# Patient Record
Sex: Female | Born: 1950
Health system: Southern US, Community
[De-identification: ages and names within clinical notes are randomized; demographics above are authoritative.]

## PROBLEM LIST (undated history)

## (undated) DIAGNOSIS — D751 Secondary polycythemia: Secondary | ICD-10-CM

## (undated) DIAGNOSIS — D45 Polycythemia vera: Secondary | ICD-10-CM

## (undated) DIAGNOSIS — K219 Gastro-esophageal reflux disease without esophagitis: Secondary | ICD-10-CM

## (undated) DIAGNOSIS — I10 Essential (primary) hypertension: Secondary | ICD-10-CM

## (undated) DIAGNOSIS — Z8739 Personal history of other diseases of the musculoskeletal system and connective tissue: Secondary | ICD-10-CM

## (undated) HISTORY — DX: Essential (primary) hypertension: I10

## (undated) HISTORY — DX: Polycythemia vera: D45

## (undated) HISTORY — DX: Gastro-esophageal reflux disease without esophagitis: K21.9

## (undated) HISTORY — DX: Secondary polycythemia: D75.1

## (undated) HISTORY — DX: Personal history of other diseases of the musculoskeletal system and connective tissue: Z87.39

## (undated) HISTORY — PX: CHOLECYSTECTOMY: SHX55

---

## 1976-05-10 HISTORY — PX: TUBAL LIGATION: SHX77

## 2004-08-04 ENCOUNTER — Ambulatory Visit: Payer: Self-pay | Admitting: Obstetrics and Gynecology

## 2004-08-10 ENCOUNTER — Ambulatory Visit: Payer: Self-pay | Admitting: Obstetrics and Gynecology

## 2005-02-03 ENCOUNTER — Ambulatory Visit: Payer: Self-pay | Admitting: Obstetrics and Gynecology

## 2005-10-21 ENCOUNTER — Ambulatory Visit: Payer: Self-pay | Admitting: Obstetrics and Gynecology

## 2006-10-24 ENCOUNTER — Ambulatory Visit: Payer: Self-pay | Admitting: Obstetrics and Gynecology

## 2007-10-26 ENCOUNTER — Ambulatory Visit: Payer: Self-pay | Admitting: Obstetrics and Gynecology

## 2008-10-28 ENCOUNTER — Ambulatory Visit: Payer: Self-pay | Admitting: Obstetrics and Gynecology

## 2009-10-30 ENCOUNTER — Ambulatory Visit: Payer: Self-pay | Admitting: Obstetrics and Gynecology

## 2010-11-03 ENCOUNTER — Ambulatory Visit: Payer: Self-pay | Admitting: Obstetrics and Gynecology

## 2011-11-04 ENCOUNTER — Ambulatory Visit: Payer: Self-pay | Admitting: Obstetrics and Gynecology

## 2012-11-07 ENCOUNTER — Ambulatory Visit: Payer: Self-pay | Admitting: Obstetrics and Gynecology

## 2013-12-28 ENCOUNTER — Ambulatory Visit: Payer: Self-pay | Admitting: Internal Medicine

## 2013-12-28 LAB — CBC CANCER CENTER
BASOS PCT: 0.8 %
Basophil #: 0.1 x10 3/mm (ref 0.0–0.1)
EOS ABS: 0.1 x10 3/mm (ref 0.0–0.7)
Eosinophil %: 1.6 %
HCT: 48.9 % — ABNORMAL HIGH (ref 35.0–47.0)
HGB: 16 g/dL (ref 12.0–16.0)
LYMPHS PCT: 19 %
Lymphocyte #: 1.7 x10 3/mm (ref 1.0–3.6)
MCH: 28.8 pg (ref 26.0–34.0)
MCHC: 32.7 g/dL (ref 32.0–36.0)
MCV: 88 fL (ref 80–100)
Monocyte #: 0.8 x10 3/mm (ref 0.2–0.9)
Monocyte %: 9.3 %
NEUTROS ABS: 6.3 x10 3/mm (ref 1.4–6.5)
Neutrophil %: 69.3 %
PLATELETS: 311 x10 3/mm (ref 150–440)
RBC: 5.54 10*6/uL — AB (ref 3.80–5.20)
RDW: 14.1 % (ref 11.5–14.5)
WBC: 9.1 x10 3/mm (ref 3.6–11.0)

## 2013-12-28 LAB — IRON AND TIBC
IRON SATURATION: 28 %
Iron Bind.Cap.(Total): 364 ug/dL (ref 250–450)
Iron: 102 ug/dL (ref 50–170)
Unbound Iron-Bind.Cap.: 262 ug/dL

## 2013-12-28 LAB — FERRITIN: FERRITIN (ARMC): 27 ng/mL (ref 8–388)

## 2014-01-04 LAB — CANCER CENTER HEMATOCRIT: HCT: 44.5 % (ref 35.0–47.0)

## 2014-01-07 ENCOUNTER — Ambulatory Visit: Payer: Self-pay | Admitting: Internal Medicine

## 2014-01-08 ENCOUNTER — Ambulatory Visit: Payer: Self-pay | Admitting: Internal Medicine

## 2014-01-11 LAB — CANCER CENTER HEMATOCRIT: HCT: 43.3 % (ref 35.0–47.0)

## 2014-01-18 LAB — CANCER CENTER HEMATOCRIT: HCT: 41.7 % (ref 35.0–47.0)

## 2014-01-25 LAB — CBC CANCER CENTER
BASOS PCT: 0.8 %
Basophil #: 0.1 x10 3/mm (ref 0.0–0.1)
Eosinophil #: 0.2 x10 3/mm (ref 0.0–0.7)
Eosinophil %: 2.4 %
HCT: 42.1 % (ref 35.0–47.0)
HGB: 13.9 g/dL (ref 12.0–16.0)
LYMPHS PCT: 25.1 %
Lymphocyte #: 2.3 x10 3/mm (ref 1.0–3.6)
MCH: 28.9 pg (ref 26.0–34.0)
MCHC: 33 g/dL (ref 32.0–36.0)
MCV: 88 fL (ref 80–100)
MONO ABS: 0.9 x10 3/mm (ref 0.2–0.9)
Monocyte %: 9.6 %
NEUTROS ABS: 5.8 x10 3/mm (ref 1.4–6.5)
Neutrophil %: 62.1 %
Platelet: 348 x10 3/mm (ref 150–440)
RBC: 4.81 10*6/uL (ref 3.80–5.20)
RDW: 13.6 % (ref 11.5–14.5)
WBC: 9.3 x10 3/mm (ref 3.6–11.0)

## 2014-02-07 ENCOUNTER — Ambulatory Visit: Payer: Self-pay | Admitting: Internal Medicine

## 2014-02-15 LAB — CANCER CENTER HEMATOCRIT: HCT: 42.4 % (ref 35.0–47.0)

## 2014-03-08 LAB — CANCER CENTER HEMATOCRIT: HCT: 44.2 % (ref 35.0–47.0)

## 2014-03-10 ENCOUNTER — Ambulatory Visit: Payer: Self-pay | Admitting: Internal Medicine

## 2014-03-29 LAB — HEMATOCRIT: HCT: 40.1 % (ref 35.0–47.0)

## 2014-04-09 ENCOUNTER — Ambulatory Visit: Payer: Self-pay | Admitting: Internal Medicine

## 2014-04-19 LAB — CANCER CENTER HEMATOCRIT: HCT: 40 % (ref 35.0–47.0)

## 2014-05-08 LAB — CANCER CENTER HEMATOCRIT: HCT: 41.1 % (ref 35.0–47.0)

## 2014-05-10 ENCOUNTER — Ambulatory Visit: Payer: Self-pay | Admitting: Internal Medicine

## 2014-06-21 ENCOUNTER — Ambulatory Visit: Payer: Self-pay | Admitting: Internal Medicine

## 2014-07-09 ENCOUNTER — Ambulatory Visit
Admit: 2014-07-09 | Disposition: A | Discharge status: Home / Self Care | Payer: Self-pay | Attending: Internal Medicine | Admitting: Internal Medicine

## 2014-08-09 ENCOUNTER — Ambulatory Visit
Admit: 2014-08-09 | Disposition: A | Discharge status: Home / Self Care | Payer: Self-pay | Attending: Internal Medicine | Admitting: Internal Medicine

## 2014-10-04 ENCOUNTER — Other Ambulatory Visit: Payer: Self-pay | Admitting: *Deleted

## 2014-10-04 ENCOUNTER — Encounter (INDEPENDENT_AMBULATORY_CARE_PROVIDER_SITE_OTHER): Payer: Self-pay

## 2014-10-04 ENCOUNTER — Inpatient Hospital Stay: Payer: BLUE CROSS/BLUE SHIELD

## 2014-10-04 ENCOUNTER — Inpatient Hospital Stay: Payer: BLUE CROSS/BLUE SHIELD | Attending: Internal Medicine

## 2014-10-04 DIAGNOSIS — D751 Secondary polycythemia: Secondary | ICD-10-CM

## 2014-10-04 DIAGNOSIS — D45 Polycythemia vera: Secondary | ICD-10-CM | POA: Diagnosis present

## 2014-10-04 LAB — HEMATOCRIT: HCT: 41.7 % (ref 35.0–47.0)

## 2014-12-27 ENCOUNTER — Inpatient Hospital Stay: Payer: BLUE CROSS/BLUE SHIELD | Attending: Internal Medicine

## 2014-12-27 ENCOUNTER — Inpatient Hospital Stay: Payer: BLUE CROSS/BLUE SHIELD

## 2014-12-27 DIAGNOSIS — D45 Polycythemia vera: Secondary | ICD-10-CM | POA: Insufficient documentation

## 2014-12-27 DIAGNOSIS — D751 Secondary polycythemia: Secondary | ICD-10-CM

## 2014-12-27 LAB — HEMATOCRIT: HEMATOCRIT: 40.4 % (ref 35.0–47.0)

## 2014-12-31 ENCOUNTER — Other Ambulatory Visit: Payer: Self-pay | Admitting: Internal Medicine

## 2014-12-31 DIAGNOSIS — Z1231 Encounter for screening mammogram for malignant neoplasm of breast: Secondary | ICD-10-CM

## 2015-01-09 ENCOUNTER — Ambulatory Visit
Admission: RE | Admit: 2015-01-09 | Discharge: 2015-01-09 | Disposition: A | Discharge status: Home / Self Care | Payer: BLUE CROSS/BLUE SHIELD | Source: Ambulatory Visit | Attending: Internal Medicine | Admitting: Internal Medicine

## 2015-01-09 DIAGNOSIS — Z1231 Encounter for screening mammogram for malignant neoplasm of breast: Secondary | ICD-10-CM | POA: Diagnosis present

## 2015-03-21 ENCOUNTER — Inpatient Hospital Stay: Payer: BLUE CROSS/BLUE SHIELD

## 2015-03-21 ENCOUNTER — Inpatient Hospital Stay: Payer: BLUE CROSS/BLUE SHIELD | Attending: Internal Medicine

## 2015-03-21 DIAGNOSIS — D751 Secondary polycythemia: Secondary | ICD-10-CM

## 2015-03-21 DIAGNOSIS — D45 Polycythemia vera: Secondary | ICD-10-CM | POA: Insufficient documentation

## 2015-03-21 LAB — HEMATOCRIT: HCT: 39.5 % (ref 35.0–47.0)

## 2015-06-11 ENCOUNTER — Encounter: Payer: Self-pay | Admitting: *Deleted

## 2015-06-11 ENCOUNTER — Other Ambulatory Visit: Payer: Self-pay | Admitting: *Deleted

## 2015-06-11 DIAGNOSIS — D751 Secondary polycythemia: Secondary | ICD-10-CM

## 2015-06-13 ENCOUNTER — Inpatient Hospital Stay: Payer: BLUE CROSS/BLUE SHIELD | Attending: Internal Medicine | Admitting: Internal Medicine

## 2015-06-13 ENCOUNTER — Inpatient Hospital Stay: Payer: BLUE CROSS/BLUE SHIELD

## 2015-06-13 ENCOUNTER — Encounter: Payer: Self-pay | Admitting: Internal Medicine

## 2015-06-13 VITALS — BP 136/68 | HR 76 | Temp 97.4°F | Resp 18 | Ht 63.0 in | Wt 229.9 lb

## 2015-06-13 DIAGNOSIS — K219 Gastro-esophageal reflux disease without esophagitis: Secondary | ICD-10-CM | POA: Diagnosis not present

## 2015-06-13 DIAGNOSIS — D751 Secondary polycythemia: Secondary | ICD-10-CM

## 2015-06-13 DIAGNOSIS — I10 Essential (primary) hypertension: Secondary | ICD-10-CM | POA: Insufficient documentation

## 2015-06-13 DIAGNOSIS — Z79899 Other long term (current) drug therapy: Secondary | ICD-10-CM | POA: Diagnosis not present

## 2015-06-13 LAB — CBC WITH DIFFERENTIAL/PLATELET
BASOS PCT: 1 %
Basophils Absolute: 0.1 10*3/uL (ref 0–0.1)
EOS ABS: 0.2 10*3/uL (ref 0–0.7)
EOS PCT: 2 %
HEMATOCRIT: 40.2 % (ref 35.0–47.0)
HEMOGLOBIN: 13.3 g/dL (ref 12.0–16.0)
Lymphocytes Relative: 20 %
Lymphs Abs: 1.7 10*3/uL (ref 1.0–3.6)
MCH: 23.8 pg — AB (ref 26.0–34.0)
MCHC: 33.1 g/dL (ref 32.0–36.0)
MCV: 71.8 fL — ABNORMAL LOW (ref 80.0–100.0)
MONO ABS: 0.6 10*3/uL (ref 0.2–0.9)
MONOS PCT: 7 %
NEUTROS PCT: 70 %
Neutro Abs: 6 10*3/uL (ref 1.4–6.5)
Platelets: 339 10*3/uL (ref 150–440)
RBC: 5.6 MIL/uL — AB (ref 3.80–5.20)
RDW: 16.4 % — ABNORMAL HIGH (ref 11.5–14.5)
WBC: 8.6 10*3/uL (ref 3.6–11.0)

## 2015-06-13 NOTE — Progress Notes (Signed)
[facility] OFFICE PROGRESS NOTE  Patient Care Team: Idelle Crouch, MD as PCP - General (Internal Medicine)   SUMMARY OF ONCOLOGIC HISTORY:  # AUG 2015- POLYCYTHEMIA SECONDARY- Jak-2 & exon12- NEG  INTERVAL HISTORY:  This is my first interaction with the patient since I joined the practice September 2016. I reviewed the patient's prior charts/pertinent labs/imaging in detail; findings are summarized above.   A very pleasant 65 year old obese Caucasian female patient with above history of secondary erythrocytosis is here for follow-up. She had had phlebotomies every 3-4 months; most recent being in October 2016.  Patient denies any recent DVT PEs. Denies any symptomatic improvement after phlebotomies.  She denies any blood in stools black stools.    REVIEW OF SYSTEMS:  A complete 10 point review of system is done which is negative except mentioned above/history of present illness.   PAST MEDICAL HISTORY :  Past Medical History  Diagnosis Date  . Erythrocytosis   . Polycythemia vera ([place])   . Hypertension   . GERD (gastroesophageal reflux disease)   . H/O degenerative disc disease     PAST SURGICAL HISTORY :   Past Surgical History  Procedure Laterality Date  . Cholecystectomy    . Tubal ligation  1978    FAMILY HISTORY :   Family History  Problem Relation Age of Onset  . Diabetes Mother   . Hypertension Mother   . Hypertension Father   . Heart disease Father   . Cervical cancer Daughter   . Uterine cancer Daughter     SOCIAL HISTORY:   Social History  Substance Use Topics  . Smoking status: Never Smoker   . Smokeless tobacco: Never Used  . Alcohol Use: No    ALLERGIES:  has No Known Allergies.  MEDICATIONS:  Current Outpatient Prescriptions  Medication Sig Dispense Refill  . cetirizine (ZYRTEC) 10 MG tablet Take 10 mg by mouth daily.    . hydrochlorothiazide (HYDRODIURIL) 25 MG tablet Take 25 mg by mouth daily.    . metoprolol  (LOPRESSOR) 100 MG tablet Take 100 mg by mouth daily.    . Multiple Vitamins-Minerals (MULTIVITAMIN WITH MINERALS) tablet Take 1 tablet by mouth daily.    Marland Kitchen omeprazole (PRILOSEC) 20 MG capsule Take 20 mg by mouth daily.     No current facility-administered medications for this visit.    PHYSICAL EXAMINATION:   BP 136/68 mmHg  Pulse 76  Temp(Src) 97.4 F (36.3 C) (Tympanic)  Resp 18  Ht 5\' 3"  (1.6 m)  Wt 229 lb 15 oz (104.3 kg)  BMI 40.74 kg/m2  Filed Weights   06/13/15 1430  Weight: 229 lb 15 oz (104.3 kg)    GENERAL: Well-nourished well-developed; Alert, no distress and comfortable. Obese.  EYES: no pallor or icterus OROPHARYNX: no thrush or ulceration; good dentition  NECK: supple, no masses felt LYMPH:  no palpable lymphadenopathy in the cervical, axillary or inguinal regions LUNGS: clear to auscultation and  No wheeze or crackles HEART/CVS: regular rate & rhythm and no murmurs; No lower extremity edema ABDOMEN:abdomen soft, non-tender and normal bowel sounds Musculoskeletal:no cyanosis of digits and no clubbing  PSYCH: alert & oriented x 3 with fluent speech NEURO: no focal motor/sensory deficits SKIN:  no rashes or significant lesions  LABORATORY DATA:  I have reviewed the data as listed No results found for: NA, K, CL, CO2, GLUCOSE, BUN, CREATININE, CALCIUM, PROT, ALBUMIN, AST, ALT, ALKPHOS, BILITOT, GFRNONAA, GFRAA  No results found for: SPEP, UPEP  Lab  Results  Component Value Date   WBC 8.6 06/13/2015   NEUTROABS 6.0 06/13/2015   HGB 13.3 06/13/2015   HCT 40.2 06/13/2015   MCV 71.8* 06/13/2015   PLT 339 06/13/2015      Chemistry   No results found for: NA, K, CL, CO2, BUN, CREATININE, GLU No results found for: CALCIUM, ALKPHOS, AST, ALT, BILITOT      ASSESSMENT & PLAN:   # ERYTHROCYTOSIS- likely secondary. Today hemoglobin is 13/hematocrit is 40. As this is likely secondary erythrocytosis- I would not recommend any phlebotomies unless patient is  symptomatic from elevated hematocrit [symptoms like headaches/generalized malaise etc.].  #  Microcytosis left to from phlebotomies/without anemia.likely iron deficiency. Patient not losing any blood in stools or black stools. Patient has previous colonoscopies. Patient stated that she is again to sometime this year. I asked her to Dr. PCP regarding follow-up colonoscopy. She agrees.  At the patient's request she will be discharged from the clinic. She was given a copy of the blood work today.  # 15 minutes face-to-face with the patient discussing the above plan of care; more than 50% of time spent on natural history; counseling and coordination.     Cammie Sickle, MD 06/13/2015 2:40 PM

## 2015-09-16 DIAGNOSIS — D751 Secondary polycythemia: Secondary | ICD-10-CM | POA: Diagnosis not present

## 2015-12-22 ENCOUNTER — Other Ambulatory Visit: Payer: Self-pay | Admitting: Internal Medicine

## 2015-12-22 DIAGNOSIS — Z1211 Encounter for screening for malignant neoplasm of colon: Secondary | ICD-10-CM | POA: Diagnosis not present

## 2015-12-22 DIAGNOSIS — Z1239 Encounter for other screening for malignant neoplasm of breast: Secondary | ICD-10-CM | POA: Diagnosis not present

## 2015-12-22 DIAGNOSIS — Z01419 Encounter for gynecological examination (general) (routine) without abnormal findings: Secondary | ICD-10-CM | POA: Diagnosis not present

## 2015-12-22 DIAGNOSIS — Z0000 Encounter for general adult medical examination without abnormal findings: Secondary | ICD-10-CM | POA: Diagnosis not present

## 2015-12-22 DIAGNOSIS — Z1231 Encounter for screening mammogram for malignant neoplasm of breast: Secondary | ICD-10-CM

## 2015-12-22 DIAGNOSIS — I10 Essential (primary) hypertension: Secondary | ICD-10-CM | POA: Diagnosis not present

## 2015-12-22 DIAGNOSIS — E7800 Pure hypercholesterolemia, unspecified: Secondary | ICD-10-CM | POA: Diagnosis not present

## 2015-12-22 DIAGNOSIS — Z79899 Other long term (current) drug therapy: Secondary | ICD-10-CM | POA: Diagnosis not present

## 2015-12-23 DIAGNOSIS — E7800 Pure hypercholesterolemia, unspecified: Secondary | ICD-10-CM | POA: Diagnosis not present

## 2015-12-23 DIAGNOSIS — Z79899 Other long term (current) drug therapy: Secondary | ICD-10-CM | POA: Diagnosis not present

## 2015-12-23 DIAGNOSIS — I10 Essential (primary) hypertension: Secondary | ICD-10-CM | POA: Diagnosis not present

## 2016-01-13 ENCOUNTER — Ambulatory Visit
Admission: RE | Admit: 2016-01-13 | Discharge: 2016-01-13 | Disposition: A | Discharge status: Home / Self Care | Payer: PPO | Source: Ambulatory Visit | Attending: Internal Medicine | Admitting: Internal Medicine

## 2016-01-13 DIAGNOSIS — Z1231 Encounter for screening mammogram for malignant neoplasm of breast: Secondary | ICD-10-CM | POA: Insufficient documentation

## 2016-01-30 DIAGNOSIS — Z23 Encounter for immunization: Secondary | ICD-10-CM | POA: Diagnosis not present

## 2016-04-23 DIAGNOSIS — H2513 Age-related nuclear cataract, bilateral: Secondary | ICD-10-CM | POA: Diagnosis not present

## 2016-06-21 DIAGNOSIS — Z0000 Encounter for general adult medical examination without abnormal findings: Secondary | ICD-10-CM | POA: Diagnosis not present

## 2016-06-21 DIAGNOSIS — E7800 Pure hypercholesterolemia, unspecified: Secondary | ICD-10-CM | POA: Diagnosis not present

## 2016-06-21 DIAGNOSIS — Z79899 Other long term (current) drug therapy: Secondary | ICD-10-CM | POA: Diagnosis not present

## 2016-06-21 DIAGNOSIS — I10 Essential (primary) hypertension: Secondary | ICD-10-CM | POA: Diagnosis not present

## 2016-06-21 DIAGNOSIS — F418 Other specified anxiety disorders: Secondary | ICD-10-CM | POA: Diagnosis not present

## 2016-06-21 DIAGNOSIS — M5137 Other intervertebral disc degeneration, lumbosacral region: Secondary | ICD-10-CM | POA: Diagnosis not present

## 2016-06-28 DIAGNOSIS — Z1211 Encounter for screening for malignant neoplasm of colon: Secondary | ICD-10-CM | POA: Diagnosis not present

## 2016-12-21 ENCOUNTER — Other Ambulatory Visit: Payer: Self-pay | Admitting: Internal Medicine

## 2016-12-21 DIAGNOSIS — Z1231 Encounter for screening mammogram for malignant neoplasm of breast: Secondary | ICD-10-CM

## 2016-12-22 DIAGNOSIS — M5137 Other intervertebral disc degeneration, lumbosacral region: Secondary | ICD-10-CM | POA: Diagnosis not present

## 2016-12-22 DIAGNOSIS — Z79899 Other long term (current) drug therapy: Secondary | ICD-10-CM | POA: Diagnosis not present

## 2016-12-22 DIAGNOSIS — Z0000 Encounter for general adult medical examination without abnormal findings: Secondary | ICD-10-CM | POA: Diagnosis not present

## 2016-12-22 DIAGNOSIS — I10 Essential (primary) hypertension: Secondary | ICD-10-CM | POA: Diagnosis not present

## 2016-12-22 DIAGNOSIS — Z1231 Encounter for screening mammogram for malignant neoplasm of breast: Secondary | ICD-10-CM | POA: Diagnosis not present

## 2016-12-22 DIAGNOSIS — E7800 Pure hypercholesterolemia, unspecified: Secondary | ICD-10-CM | POA: Diagnosis not present

## 2016-12-22 DIAGNOSIS — D751 Secondary polycythemia: Secondary | ICD-10-CM | POA: Diagnosis not present

## 2016-12-30 DIAGNOSIS — I10 Essential (primary) hypertension: Secondary | ICD-10-CM | POA: Diagnosis not present

## 2017-01-17 ENCOUNTER — Ambulatory Visit
Admission: RE | Admit: 2017-01-17 | Discharge: 2017-01-17 | Disposition: A | Discharge status: Home / Self Care | Payer: PPO | Source: Ambulatory Visit | Attending: Internal Medicine | Admitting: Internal Medicine

## 2017-01-17 DIAGNOSIS — Z1231 Encounter for screening mammogram for malignant neoplasm of breast: Secondary | ICD-10-CM | POA: Diagnosis not present

## 2017-01-19 ENCOUNTER — Other Ambulatory Visit: Payer: Self-pay | Admitting: Internal Medicine

## 2017-01-19 DIAGNOSIS — N6489 Other specified disorders of breast: Secondary | ICD-10-CM

## 2017-01-19 DIAGNOSIS — R928 Other abnormal and inconclusive findings on diagnostic imaging of breast: Secondary | ICD-10-CM

## 2017-01-25 ENCOUNTER — Ambulatory Visit
Admission: RE | Admit: 2017-01-25 | Discharge: 2017-01-25 | Disposition: A | Discharge status: Home / Self Care | Payer: PPO | Source: Ambulatory Visit | Attending: Internal Medicine | Admitting: Internal Medicine

## 2017-01-25 DIAGNOSIS — N6489 Other specified disorders of breast: Secondary | ICD-10-CM | POA: Diagnosis not present

## 2017-01-25 DIAGNOSIS — R928 Other abnormal and inconclusive findings on diagnostic imaging of breast: Secondary | ICD-10-CM | POA: Diagnosis not present

## 2017-01-28 DIAGNOSIS — Z23 Encounter for immunization: Secondary | ICD-10-CM | POA: Diagnosis not present

## 2017-06-24 DIAGNOSIS — Z1211 Encounter for screening for malignant neoplasm of colon: Secondary | ICD-10-CM | POA: Diagnosis not present

## 2017-06-24 DIAGNOSIS — M5137 Other intervertebral disc degeneration, lumbosacral region: Secondary | ICD-10-CM | POA: Diagnosis not present

## 2017-06-24 DIAGNOSIS — Z79899 Other long term (current) drug therapy: Secondary | ICD-10-CM | POA: Diagnosis not present

## 2017-06-24 DIAGNOSIS — Z0000 Encounter for general adult medical examination without abnormal findings: Secondary | ICD-10-CM | POA: Diagnosis not present

## 2017-06-24 DIAGNOSIS — I10 Essential (primary) hypertension: Secondary | ICD-10-CM | POA: Diagnosis not present

## 2017-06-24 DIAGNOSIS — D751 Secondary polycythemia: Secondary | ICD-10-CM | POA: Diagnosis not present

## 2017-06-24 DIAGNOSIS — E7800 Pure hypercholesterolemia, unspecified: Secondary | ICD-10-CM | POA: Diagnosis not present

## 2017-08-19 DIAGNOSIS — Z1211 Encounter for screening for malignant neoplasm of colon: Secondary | ICD-10-CM | POA: Diagnosis not present

## 2018-01-02 DIAGNOSIS — Z0000 Encounter for general adult medical examination without abnormal findings: Secondary | ICD-10-CM | POA: Diagnosis not present

## 2018-01-02 DIAGNOSIS — Z13820 Encounter for screening for osteoporosis: Secondary | ICD-10-CM | POA: Diagnosis not present

## 2018-01-02 DIAGNOSIS — Z79899 Other long term (current) drug therapy: Secondary | ICD-10-CM | POA: Diagnosis not present

## 2018-01-02 DIAGNOSIS — D751 Secondary polycythemia: Secondary | ICD-10-CM | POA: Diagnosis not present

## 2018-01-02 DIAGNOSIS — I10 Essential (primary) hypertension: Secondary | ICD-10-CM | POA: Diagnosis not present

## 2018-01-02 DIAGNOSIS — E7800 Pure hypercholesterolemia, unspecified: Secondary | ICD-10-CM | POA: Diagnosis not present

## 2018-01-02 DIAGNOSIS — Z1239 Encounter for other screening for malignant neoplasm of breast: Secondary | ICD-10-CM | POA: Diagnosis not present

## 2018-01-03 ENCOUNTER — Other Ambulatory Visit: Payer: Self-pay | Admitting: Internal Medicine

## 2018-01-03 DIAGNOSIS — Z1231 Encounter for screening mammogram for malignant neoplasm of breast: Secondary | ICD-10-CM

## 2018-01-11 DIAGNOSIS — Z780 Asymptomatic menopausal state: Secondary | ICD-10-CM | POA: Diagnosis not present

## 2018-01-26 ENCOUNTER — Ambulatory Visit
Admission: RE | Admit: 2018-01-26 | Discharge: 2018-01-26 | Disposition: A | Discharge status: Home / Self Care | Payer: PPO | Source: Ambulatory Visit | Attending: Internal Medicine | Admitting: Internal Medicine

## 2018-01-26 DIAGNOSIS — Z1231 Encounter for screening mammogram for malignant neoplasm of breast: Secondary | ICD-10-CM

## 2018-02-27 DIAGNOSIS — H35372 Puckering of macula, left eye: Secondary | ICD-10-CM | POA: Diagnosis not present

## 2018-02-27 DIAGNOSIS — H2513 Age-related nuclear cataract, bilateral: Secondary | ICD-10-CM | POA: Diagnosis not present

## 2018-07-03 DIAGNOSIS — E7800 Pure hypercholesterolemia, unspecified: Secondary | ICD-10-CM | POA: Diagnosis not present

## 2018-07-03 DIAGNOSIS — Z79899 Other long term (current) drug therapy: Secondary | ICD-10-CM | POA: Diagnosis not present

## 2018-07-03 DIAGNOSIS — Z1211 Encounter for screening for malignant neoplasm of colon: Secondary | ICD-10-CM | POA: Diagnosis not present

## 2018-07-03 DIAGNOSIS — I10 Essential (primary) hypertension: Secondary | ICD-10-CM | POA: Diagnosis not present

## 2018-07-03 DIAGNOSIS — K219 Gastro-esophageal reflux disease without esophagitis: Secondary | ICD-10-CM | POA: Diagnosis not present

## 2018-07-03 DIAGNOSIS — Z0000 Encounter for general adult medical examination without abnormal findings: Secondary | ICD-10-CM | POA: Diagnosis not present

## 2018-07-03 DIAGNOSIS — M5137 Other intervertebral disc degeneration, lumbosacral region: Secondary | ICD-10-CM | POA: Diagnosis not present

## 2018-07-12 DIAGNOSIS — Z1212 Encounter for screening for malignant neoplasm of rectum: Secondary | ICD-10-CM | POA: Diagnosis not present

## 2018-07-12 DIAGNOSIS — Z1211 Encounter for screening for malignant neoplasm of colon: Secondary | ICD-10-CM | POA: Diagnosis not present

## 2019-01-09 DIAGNOSIS — I10 Essential (primary) hypertension: Secondary | ICD-10-CM | POA: Diagnosis not present

## 2019-01-09 DIAGNOSIS — E7800 Pure hypercholesterolemia, unspecified: Secondary | ICD-10-CM | POA: Diagnosis not present

## 2019-01-09 DIAGNOSIS — R7989 Other specified abnormal findings of blood chemistry: Secondary | ICD-10-CM | POA: Diagnosis not present

## 2019-01-09 DIAGNOSIS — Z1239 Encounter for other screening for malignant neoplasm of breast: Secondary | ICD-10-CM | POA: Diagnosis not present

## 2019-01-09 DIAGNOSIS — D751 Secondary polycythemia: Secondary | ICD-10-CM | POA: Diagnosis not present

## 2019-01-09 DIAGNOSIS — Z79899 Other long term (current) drug therapy: Secondary | ICD-10-CM | POA: Diagnosis not present

## 2019-01-09 DIAGNOSIS — Z0000 Encounter for general adult medical examination without abnormal findings: Secondary | ICD-10-CM | POA: Diagnosis not present

## 2019-01-09 DIAGNOSIS — K219 Gastro-esophageal reflux disease without esophagitis: Secondary | ICD-10-CM | POA: Diagnosis not present

## 2019-01-11 ENCOUNTER — Other Ambulatory Visit: Payer: Self-pay | Admitting: Internal Medicine

## 2019-01-11 DIAGNOSIS — Z1231 Encounter for screening mammogram for malignant neoplasm of breast: Secondary | ICD-10-CM

## 2019-02-09 DIAGNOSIS — Z23 Encounter for immunization: Secondary | ICD-10-CM | POA: Diagnosis not present

## 2019-02-14 ENCOUNTER — Ambulatory Visit
Admission: RE | Admit: 2019-02-14 | Discharge: 2019-02-14 | Disposition: A | Discharge status: Home / Self Care | Payer: PPO | Source: Ambulatory Visit | Attending: Internal Medicine | Admitting: Internal Medicine

## 2019-02-14 DIAGNOSIS — Z1231 Encounter for screening mammogram for malignant neoplasm of breast: Secondary | ICD-10-CM

## 2019-07-10 DIAGNOSIS — K219 Gastro-esophageal reflux disease without esophagitis: Secondary | ICD-10-CM | POA: Diagnosis not present

## 2019-07-10 DIAGNOSIS — I10 Essential (primary) hypertension: Secondary | ICD-10-CM | POA: Diagnosis not present

## 2019-07-10 DIAGNOSIS — Z79899 Other long term (current) drug therapy: Secondary | ICD-10-CM | POA: Diagnosis not present

## 2019-07-10 DIAGNOSIS — E7800 Pure hypercholesterolemia, unspecified: Secondary | ICD-10-CM | POA: Diagnosis not present

## 2019-07-10 DIAGNOSIS — Z0000 Encounter for general adult medical examination without abnormal findings: Secondary | ICD-10-CM | POA: Diagnosis not present

## 2019-11-21 DIAGNOSIS — H40003 Preglaucoma, unspecified, bilateral: Secondary | ICD-10-CM | POA: Diagnosis not present

## 2020-01-11 ENCOUNTER — Other Ambulatory Visit: Payer: Self-pay | Admitting: Internal Medicine

## 2020-01-11 DIAGNOSIS — Z1231 Encounter for screening mammogram for malignant neoplasm of breast: Secondary | ICD-10-CM

## 2020-01-16 DIAGNOSIS — I10 Essential (primary) hypertension: Secondary | ICD-10-CM | POA: Diagnosis not present

## 2020-01-16 DIAGNOSIS — D751 Secondary polycythemia: Secondary | ICD-10-CM | POA: Diagnosis not present

## 2020-01-16 DIAGNOSIS — Z0000 Encounter for general adult medical examination without abnormal findings: Secondary | ICD-10-CM | POA: Diagnosis not present

## 2020-01-16 DIAGNOSIS — E7800 Pure hypercholesterolemia, unspecified: Secondary | ICD-10-CM | POA: Diagnosis not present

## 2020-01-16 DIAGNOSIS — Z79899 Other long term (current) drug therapy: Secondary | ICD-10-CM | POA: Diagnosis not present

## 2020-01-16 DIAGNOSIS — K219 Gastro-esophageal reflux disease without esophagitis: Secondary | ICD-10-CM | POA: Diagnosis not present

## 2020-01-16 DIAGNOSIS — R946 Abnormal results of thyroid function studies: Secondary | ICD-10-CM | POA: Diagnosis not present

## 2020-02-13 DIAGNOSIS — Z23 Encounter for immunization: Secondary | ICD-10-CM | POA: Diagnosis not present

## 2020-02-15 ENCOUNTER — Ambulatory Visit
Admission: RE | Admit: 2020-02-15 | Discharge: 2020-02-15 | Disposition: A | Discharge status: Home / Self Care | Payer: PPO | Source: Ambulatory Visit | Attending: Internal Medicine | Admitting: Internal Medicine

## 2020-02-15 ENCOUNTER — Other Ambulatory Visit: Payer: Self-pay

## 2020-02-15 DIAGNOSIS — Z1231 Encounter for screening mammogram for malignant neoplasm of breast: Secondary | ICD-10-CM | POA: Diagnosis not present

## 2020-02-20 ENCOUNTER — Other Ambulatory Visit: Payer: Self-pay | Admitting: Internal Medicine

## 2020-02-20 DIAGNOSIS — N6320 Unspecified lump in the left breast, unspecified quadrant: Secondary | ICD-10-CM

## 2020-02-20 DIAGNOSIS — R928 Other abnormal and inconclusive findings on diagnostic imaging of breast: Secondary | ICD-10-CM

## 2020-02-29 ENCOUNTER — Other Ambulatory Visit: Payer: Self-pay

## 2020-02-29 ENCOUNTER — Ambulatory Visit
Admission: RE | Admit: 2020-02-29 | Discharge: 2020-02-29 | Disposition: A | Discharge status: Home / Self Care | Payer: PPO | Source: Ambulatory Visit | Attending: Internal Medicine | Admitting: Internal Medicine

## 2020-02-29 DIAGNOSIS — N6320 Unspecified lump in the left breast, unspecified quadrant: Secondary | ICD-10-CM | POA: Diagnosis not present

## 2020-02-29 DIAGNOSIS — N6321 Unspecified lump in the left breast, upper outer quadrant: Secondary | ICD-10-CM | POA: Diagnosis not present

## 2020-02-29 DIAGNOSIS — N6323 Unspecified lump in the left breast, lower outer quadrant: Secondary | ICD-10-CM | POA: Diagnosis not present

## 2020-02-29 DIAGNOSIS — R928 Other abnormal and inconclusive findings on diagnostic imaging of breast: Secondary | ICD-10-CM

## 2020-03-03 ENCOUNTER — Other Ambulatory Visit: Payer: Self-pay | Admitting: Internal Medicine

## 2020-03-03 DIAGNOSIS — N6320 Unspecified lump in the left breast, unspecified quadrant: Secondary | ICD-10-CM

## 2020-03-18 DIAGNOSIS — J019 Acute sinusitis, unspecified: Secondary | ICD-10-CM | POA: Diagnosis not present

## 2020-04-16 DIAGNOSIS — E7800 Pure hypercholesterolemia, unspecified: Secondary | ICD-10-CM | POA: Diagnosis not present

## 2020-04-16 DIAGNOSIS — I10 Essential (primary) hypertension: Secondary | ICD-10-CM | POA: Diagnosis not present

## 2020-04-16 DIAGNOSIS — Z79899 Other long term (current) drug therapy: Secondary | ICD-10-CM | POA: Diagnosis not present

## 2020-09-01 ENCOUNTER — Ambulatory Visit
Admission: RE | Admit: 2020-09-01 | Discharge: 2020-09-01 | Disposition: A | Discharge status: Home / Self Care | Payer: PPO | Source: Ambulatory Visit | Attending: Internal Medicine | Admitting: Internal Medicine

## 2020-09-01 ENCOUNTER — Other Ambulatory Visit: Payer: Self-pay

## 2020-09-01 DIAGNOSIS — R928 Other abnormal and inconclusive findings on diagnostic imaging of breast: Secondary | ICD-10-CM | POA: Insufficient documentation

## 2020-09-01 DIAGNOSIS — N6320 Unspecified lump in the left breast, unspecified quadrant: Secondary | ICD-10-CM | POA: Diagnosis not present

## 2020-09-03 ENCOUNTER — Other Ambulatory Visit: Payer: Self-pay | Admitting: Internal Medicine

## 2020-09-03 DIAGNOSIS — N6320 Unspecified lump in the left breast, unspecified quadrant: Secondary | ICD-10-CM

## 2020-09-03 DIAGNOSIS — R928 Other abnormal and inconclusive findings on diagnostic imaging of breast: Secondary | ICD-10-CM

## 2020-09-03 DIAGNOSIS — Z1231 Encounter for screening mammogram for malignant neoplasm of breast: Secondary | ICD-10-CM

## 2020-09-04 DIAGNOSIS — Z0000 Encounter for general adult medical examination without abnormal findings: Secondary | ICD-10-CM | POA: Diagnosis not present

## 2020-09-04 DIAGNOSIS — M5137 Other intervertebral disc degeneration, lumbosacral region: Secondary | ICD-10-CM | POA: Diagnosis not present

## 2020-09-04 DIAGNOSIS — Z79899 Other long term (current) drug therapy: Secondary | ICD-10-CM | POA: Diagnosis not present

## 2020-09-04 DIAGNOSIS — G629 Polyneuropathy, unspecified: Secondary | ICD-10-CM | POA: Diagnosis not present

## 2020-09-04 DIAGNOSIS — Z1211 Encounter for screening for malignant neoplasm of colon: Secondary | ICD-10-CM | POA: Diagnosis not present

## 2020-09-04 DIAGNOSIS — E7800 Pure hypercholesterolemia, unspecified: Secondary | ICD-10-CM | POA: Diagnosis not present

## 2020-09-04 DIAGNOSIS — D751 Secondary polycythemia: Secondary | ICD-10-CM | POA: Diagnosis not present

## 2020-09-04 DIAGNOSIS — I10 Essential (primary) hypertension: Secondary | ICD-10-CM | POA: Diagnosis not present

## 2020-11-16 IMAGING — MG MM DIGITAL DIAGNOSTIC UNILAT*L* W/ TOMO W/ CAD
4 series · 4 of 12 positions shown · non-contrast
Comparison: Previous exam(s).

CLINICAL DATA: Patient was recalled from screening mammogram for
possible mass in the left breast.

EXAM:
DIGITAL DIAGNOSTIC LEFT MAMMOGRAM WITH CAD AND TOMO
ULTRASOUND LEFT BREAST

[L MLO synth-2D]
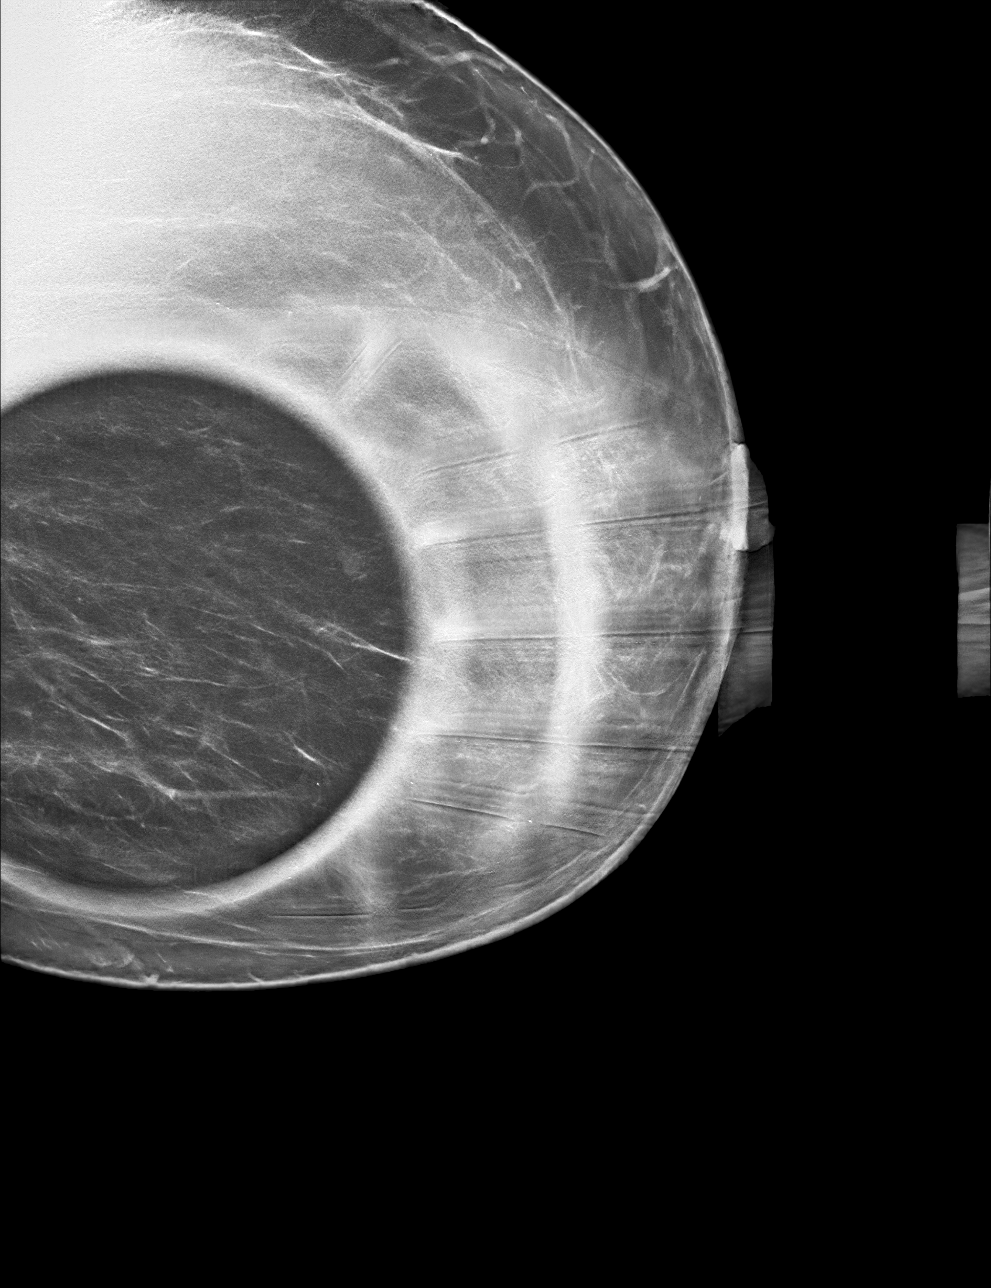

[L ML synth-2D]
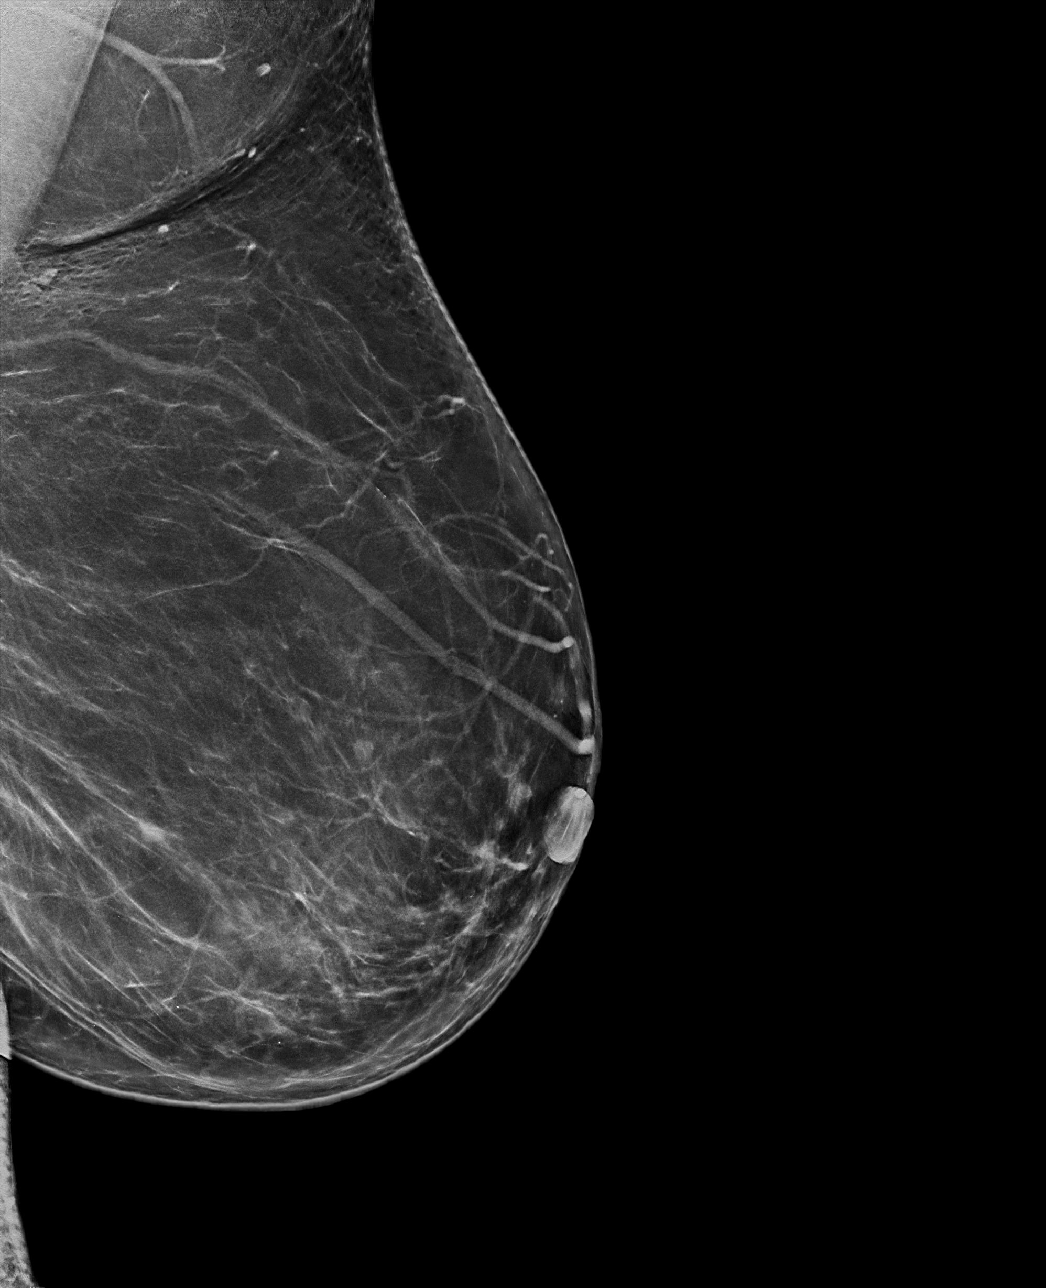

[L MLO tomo · tomo slice 39/77.0]
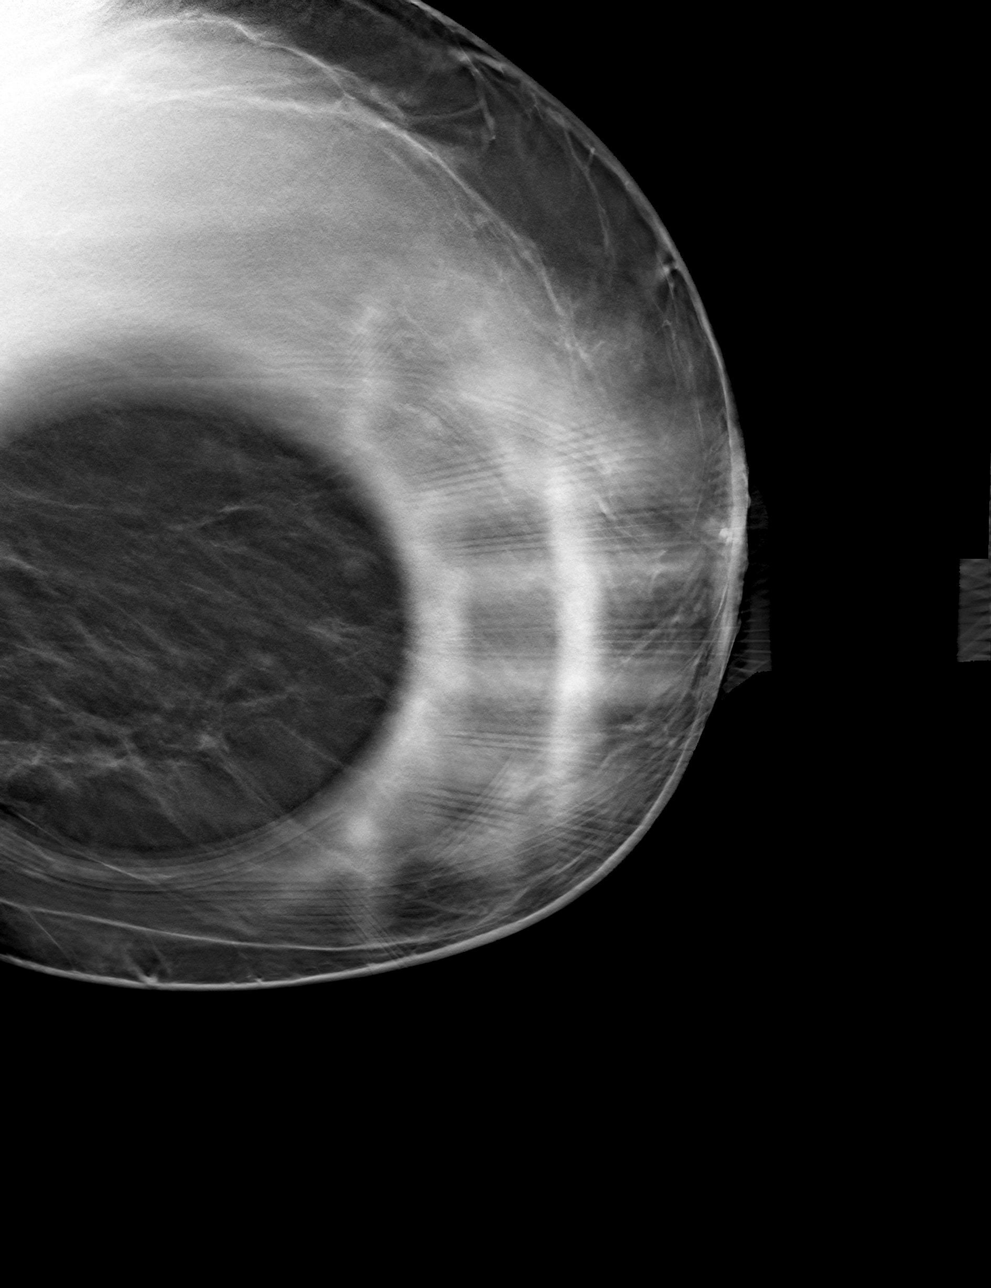

[L ML tomo · tomo slice 43/86.0]
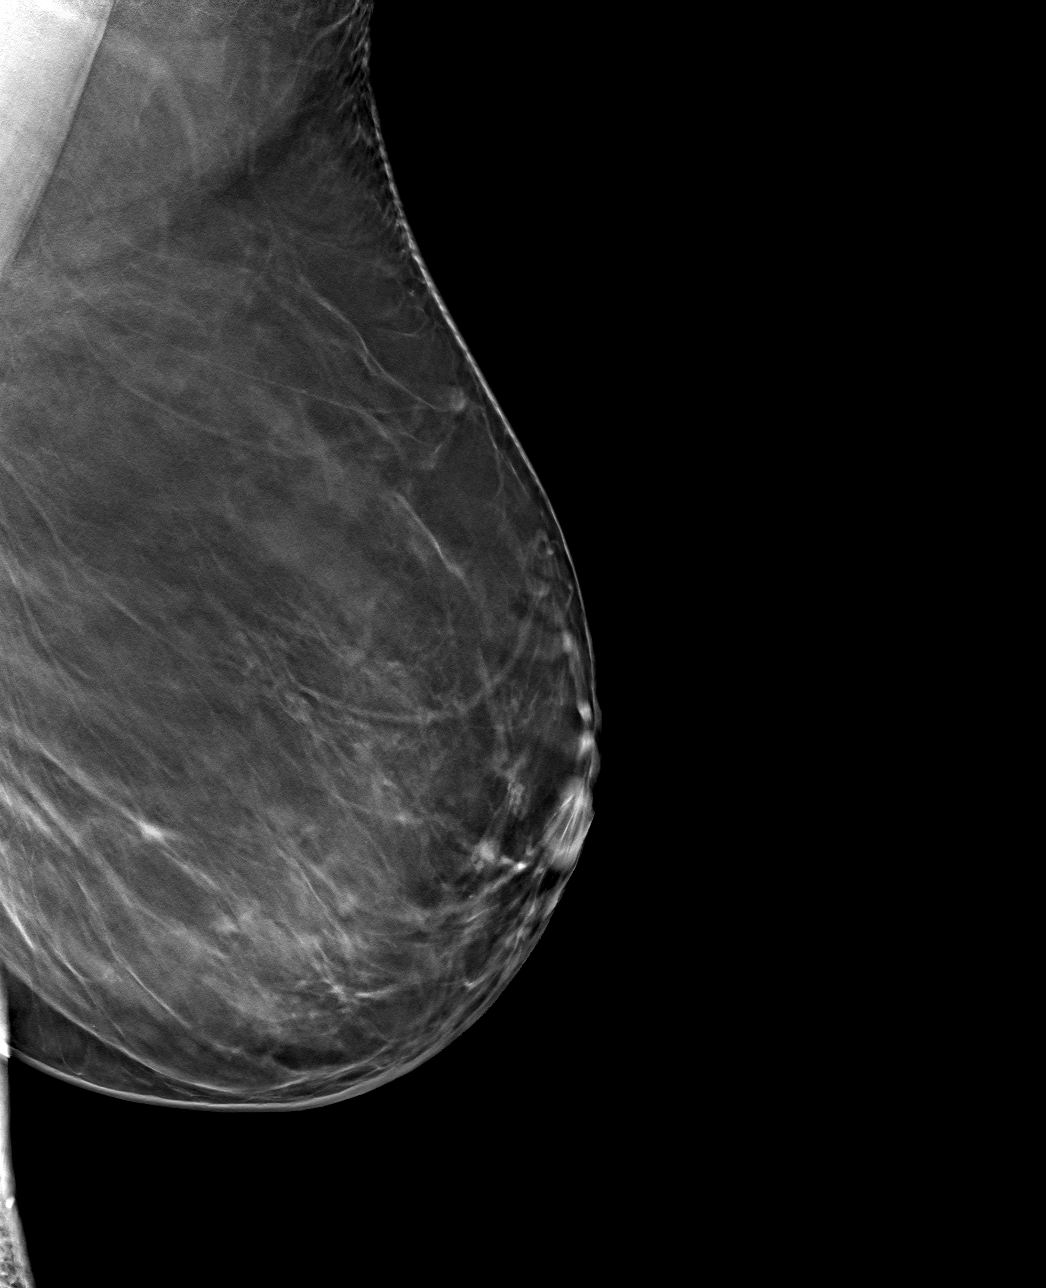

[4 of 12 positions shown; findings below may reference images not displayed]

ACR Breast Density Category b: There are scattered areas of
fibroglandular density.
FINDINGS: Additional imaging of the left breast was performed. There is
persistence of a circumscribed 4 mm mass in the lateral retroareolar
region of the left breast. There are no malignant type
microcalcifications.

Mammographic images were processed with CAD.

Targeted ultrasound is performed, showing a near anechoic mass in
the retroareolar region of the left breast at 3 o'clock measuring 5
x 4 x 4 mm. No additional mass is seen in the retroareolar region of
the breast.
IMPRESSION: Probable benign mass in the left breast.

RECOMMENDATION:
Short-term interval follow-up left mammogram and ultrasound in 6
months is recommended.

I have discussed the findings and recommendations with the patient.
If applicable, a reminder letter will be sent to the patient
regarding the next appointment.

BI-RADS CATEGORY  3: Probably benign.

## 2020-11-21 DIAGNOSIS — H40003 Preglaucoma, unspecified, bilateral: Secondary | ICD-10-CM | POA: Diagnosis not present

## 2020-11-21 DIAGNOSIS — H2513 Age-related nuclear cataract, bilateral: Secondary | ICD-10-CM | POA: Diagnosis not present

## 2021-02-05 DIAGNOSIS — Z23 Encounter for immunization: Secondary | ICD-10-CM | POA: Diagnosis not present

## 2021-02-17 ENCOUNTER — Other Ambulatory Visit: Payer: Self-pay

## 2021-02-17 ENCOUNTER — Ambulatory Visit
Admission: RE | Admit: 2021-02-17 | Discharge: 2021-02-17 | Disposition: A | Discharge status: Home / Self Care | Payer: PPO | Source: Ambulatory Visit | Attending: Internal Medicine | Admitting: Internal Medicine

## 2021-02-17 DIAGNOSIS — R928 Other abnormal and inconclusive findings on diagnostic imaging of breast: Secondary | ICD-10-CM | POA: Diagnosis not present

## 2021-02-17 DIAGNOSIS — R922 Inconclusive mammogram: Secondary | ICD-10-CM | POA: Diagnosis not present

## 2021-02-17 DIAGNOSIS — Z1231 Encounter for screening mammogram for malignant neoplasm of breast: Secondary | ICD-10-CM | POA: Diagnosis not present

## 2021-02-17 DIAGNOSIS — N6320 Unspecified lump in the left breast, unspecified quadrant: Secondary | ICD-10-CM | POA: Insufficient documentation

## 2021-03-06 DIAGNOSIS — Z6841 Body Mass Index (BMI) 40.0 and over, adult: Secondary | ICD-10-CM | POA: Diagnosis not present

## 2021-03-06 DIAGNOSIS — R946 Abnormal results of thyroid function studies: Secondary | ICD-10-CM | POA: Diagnosis not present

## 2021-03-06 DIAGNOSIS — Z0000 Encounter for general adult medical examination without abnormal findings: Secondary | ICD-10-CM | POA: Diagnosis not present

## 2021-03-06 DIAGNOSIS — E7800 Pure hypercholesterolemia, unspecified: Secondary | ICD-10-CM | POA: Diagnosis not present

## 2021-03-06 DIAGNOSIS — M5137 Other intervertebral disc degeneration, lumbosacral region: Secondary | ICD-10-CM | POA: Diagnosis not present

## 2021-03-06 DIAGNOSIS — D751 Secondary polycythemia: Secondary | ICD-10-CM | POA: Diagnosis not present

## 2021-03-06 DIAGNOSIS — Z79899 Other long term (current) drug therapy: Secondary | ICD-10-CM | POA: Diagnosis not present

## 2021-03-06 DIAGNOSIS — I10 Essential (primary) hypertension: Secondary | ICD-10-CM | POA: Diagnosis not present

## 2021-07-29 DIAGNOSIS — Z1211 Encounter for screening for malignant neoplasm of colon: Secondary | ICD-10-CM | POA: Diagnosis not present

## 2021-08-11 LAB — COLOGUARD: COLOGUARD: NEGATIVE

## 2021-09-04 DIAGNOSIS — I10 Essential (primary) hypertension: Secondary | ICD-10-CM | POA: Diagnosis not present

## 2021-09-04 DIAGNOSIS — M5137 Other intervertebral disc degeneration, lumbosacral region: Secondary | ICD-10-CM | POA: Diagnosis not present

## 2021-09-04 DIAGNOSIS — E782 Mixed hyperlipidemia: Secondary | ICD-10-CM | POA: Diagnosis not present

## 2021-09-04 DIAGNOSIS — Z6841 Body Mass Index (BMI) 40.0 and over, adult: Secondary | ICD-10-CM | POA: Diagnosis not present

## 2021-09-04 DIAGNOSIS — Z0000 Encounter for general adult medical examination without abnormal findings: Secondary | ICD-10-CM | POA: Diagnosis not present

## 2021-09-04 DIAGNOSIS — D751 Secondary polycythemia: Secondary | ICD-10-CM | POA: Diagnosis not present

## 2021-09-04 DIAGNOSIS — R946 Abnormal results of thyroid function studies: Secondary | ICD-10-CM | POA: Diagnosis not present

## 2021-09-04 DIAGNOSIS — Z79899 Other long term (current) drug therapy: Secondary | ICD-10-CM | POA: Diagnosis not present

## 2021-11-05 IMAGING — MG DIGITAL DIAGNOSTIC BILAT W/ TOMO W/ CAD
8 series · 8 of 24 positions shown · non-contrast
Comparison: Previous exam(s).

CLINICAL DATA: 70-year-old female presenting for annual exam as
well as 1 year follow-up of a probably benign left breast mass.

EXAM:
DIGITAL DIAGNOSTIC BILATERAL MAMMOGRAM WITH TOMOSYNTHESIS AND CAD;
ULTRASOUND LEFT BREAST LIMITED
TECHNIQUE: Bilateral digital diagnostic mammography and breast tomosynthesis
was performed. The images were evaluated with computer-aided
detection.; Targeted ultrasound examination of the left breast was
performed.

[L CC synth-2D]
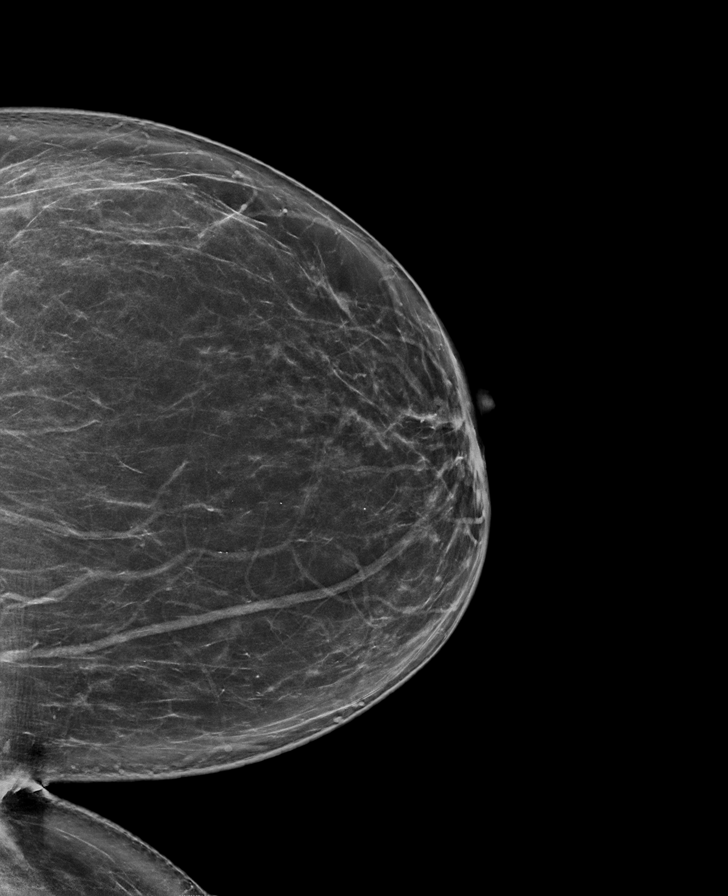

[R MLO synth-2D]
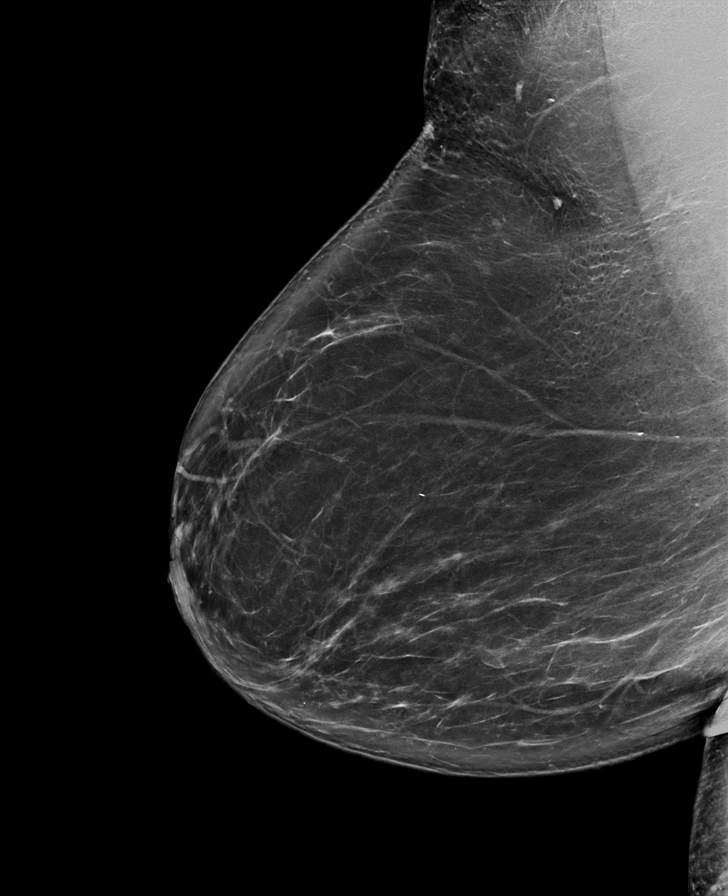

[R CC synth-2D]
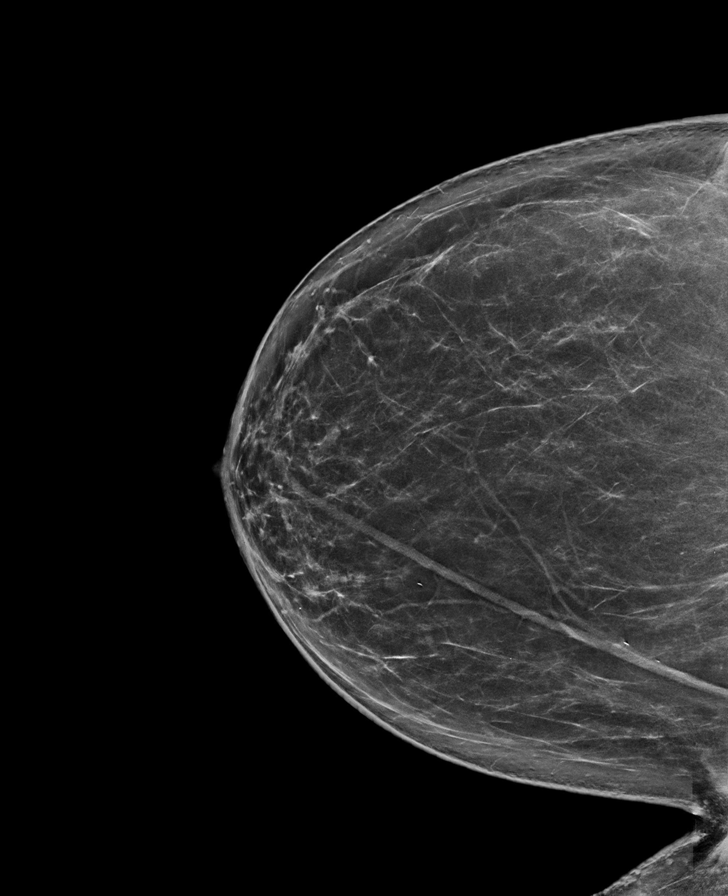

[L MLO synth-2D]
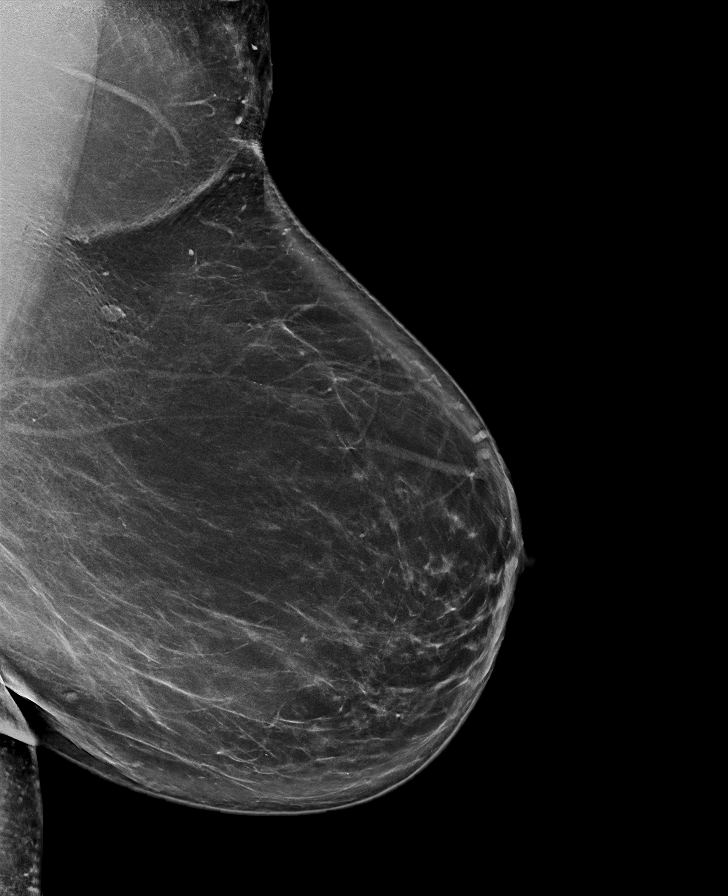

[R MLO tomo · tomo slice 47/94.0]
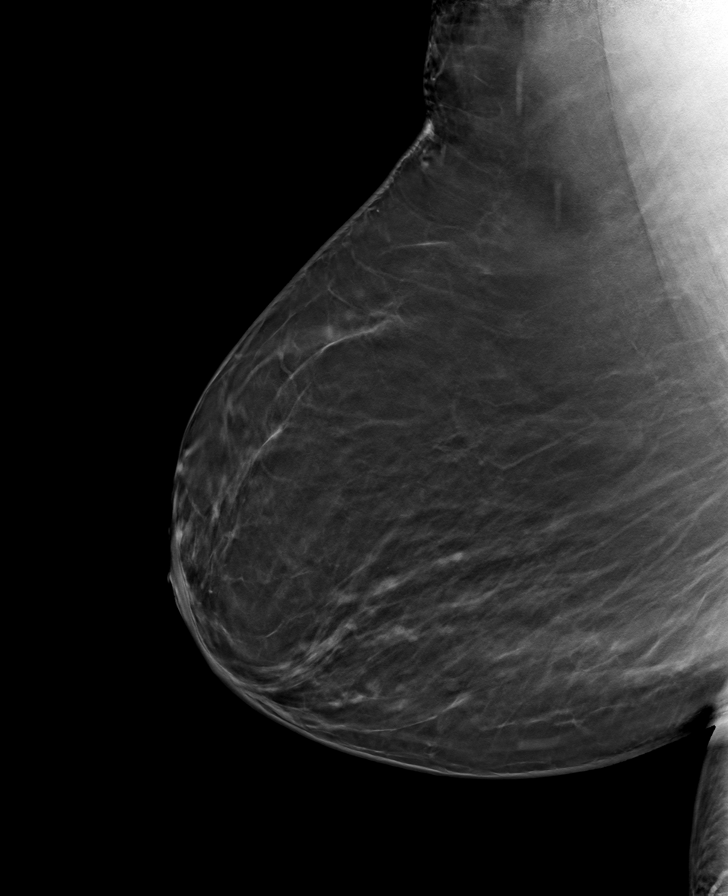

[L MLO tomo · tomo slice 49/98.0]
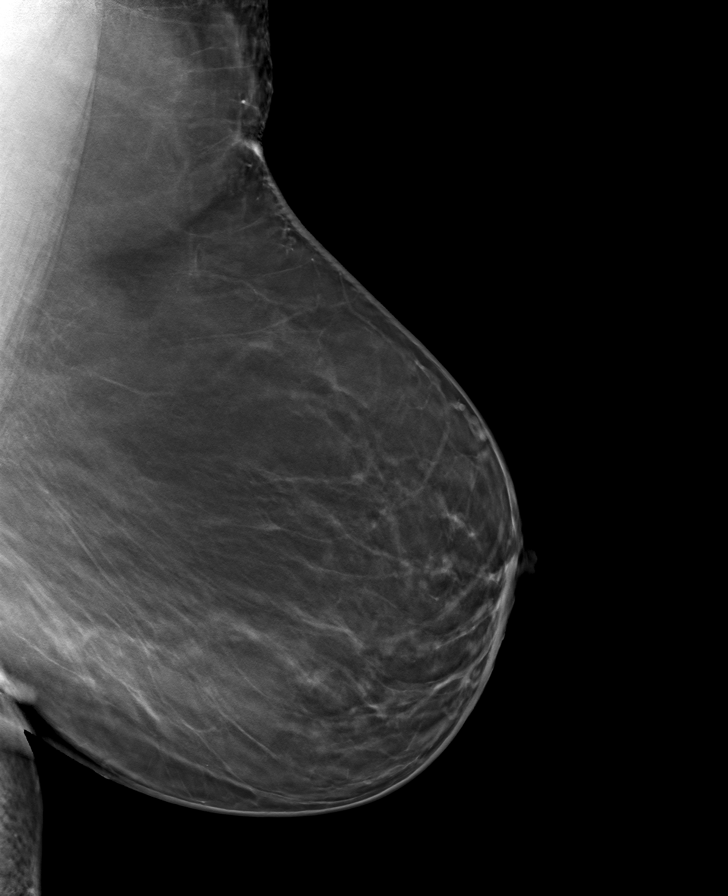

[R CC tomo · tomo slice 42/83.0]
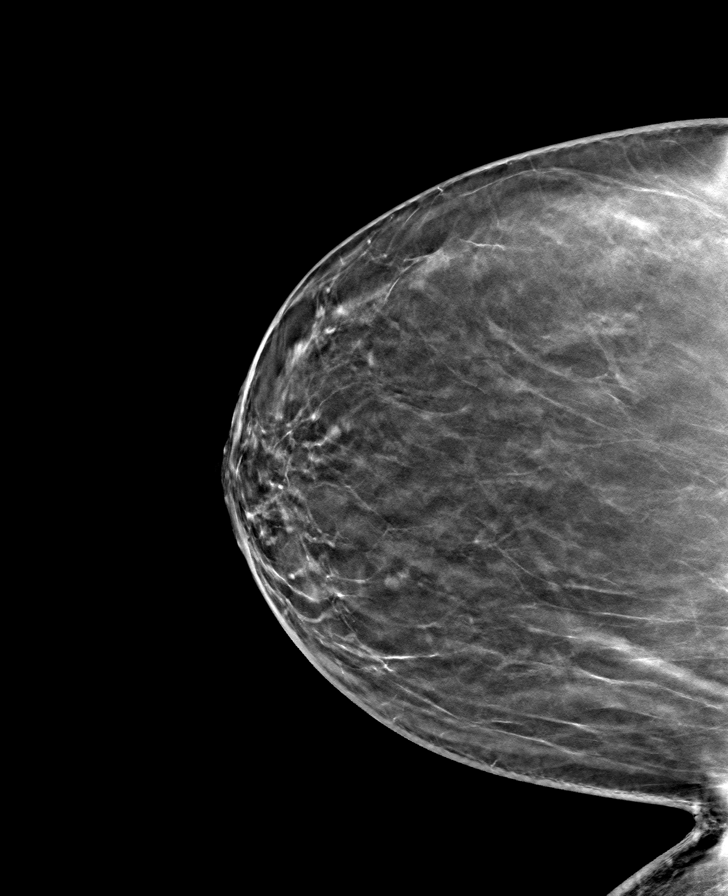

[L CC tomo · tomo slice 41/81.0]
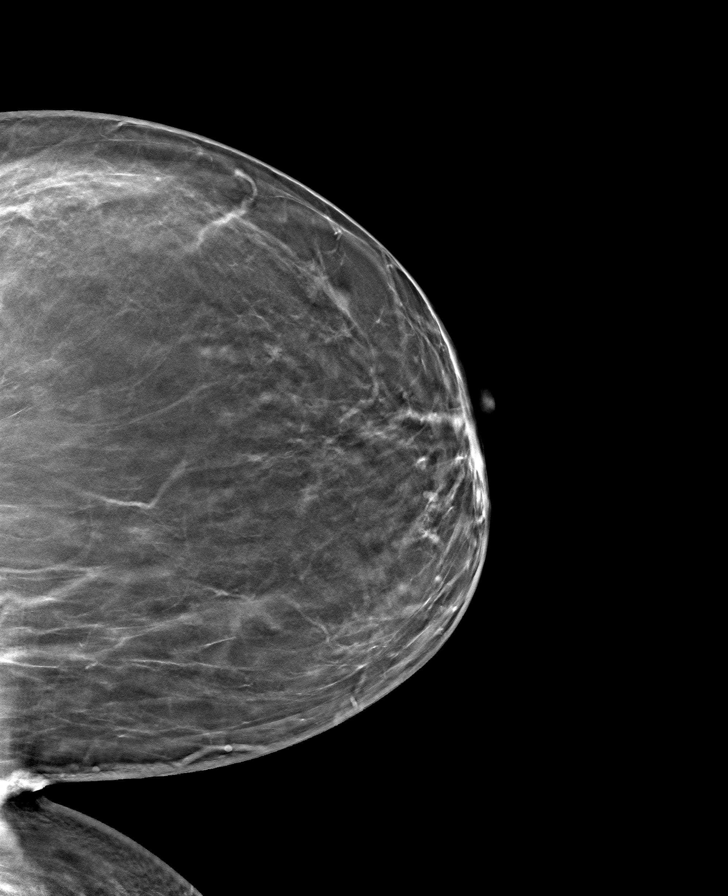

[8 of 24 positions shown; findings below may reference images not displayed]

ACR Breast Density Category b: There are scattered areas of
fibroglandular density.
FINDINGS: Mammogram:

Right breast: No suspicious mass, distortion, or microcalcifications
are identified to suggest presence of malignancy.

Left breast: The small mass in the lateral subareolar left breast is
stable to slightly decreased in size compared to prior. There are no
new suspicious findings elsewhere in the left breast.

Ultrasound:

Targeted ultrasound performed in the left breast at 3 o'clock
retroareolar demonstrating a small cystic mass measuring 0.5 x 0.4 x
0.4 cm, previously measuring 0.5 x 0.4 x 0.5 cm. No internal
vascularity.
IMPRESSION: 1. Stable probably benign mass in the left breast at retroareolar 3
o'clock.

2.  No mammographic evidence of malignancy in the right breast.

RECOMMENDATION:
Diagnostic bilateral mammogram and possible left breast ultrasound
in 1 year.

I have discussed the findings and recommendations with the patient.
If applicable, a reminder letter will be sent to the patient
regarding the next appointment.

BI-RADS CATEGORY  3: Probably benign.

## 2021-11-24 DIAGNOSIS — H2513 Age-related nuclear cataract, bilateral: Secondary | ICD-10-CM | POA: Diagnosis not present

## 2021-11-24 DIAGNOSIS — H43812 Vitreous degeneration, left eye: Secondary | ICD-10-CM | POA: Diagnosis not present

## 2021-11-24 DIAGNOSIS — H40003 Preglaucoma, unspecified, bilateral: Secondary | ICD-10-CM | POA: Diagnosis not present

## 2022-01-14 ENCOUNTER — Other Ambulatory Visit: Payer: Self-pay | Admitting: Internal Medicine

## 2022-01-14 DIAGNOSIS — N63 Unspecified lump in unspecified breast: Secondary | ICD-10-CM

## 2022-02-11 ENCOUNTER — Encounter: Payer: Self-pay | Admitting: Internal Medicine

## 2022-02-18 ENCOUNTER — Ambulatory Visit
Admission: RE | Admit: 2022-02-18 | Discharge: 2022-02-18 | Disposition: A | Discharge status: Home / Self Care | Payer: PPO | Source: Ambulatory Visit | Attending: Internal Medicine | Admitting: Internal Medicine

## 2022-02-18 DIAGNOSIS — N6325 Unspecified lump in the left breast, overlapping quadrants: Secondary | ICD-10-CM | POA: Diagnosis not present

## 2022-02-18 DIAGNOSIS — N6323 Unspecified lump in the left breast, lower outer quadrant: Secondary | ICD-10-CM | POA: Diagnosis not present

## 2022-02-18 DIAGNOSIS — N63 Unspecified lump in unspecified breast: Secondary | ICD-10-CM

## 2022-02-18 DIAGNOSIS — N6320 Unspecified lump in the left breast, unspecified quadrant: Secondary | ICD-10-CM | POA: Diagnosis present

## 2022-02-18 DIAGNOSIS — N6321 Unspecified lump in the left breast, upper outer quadrant: Secondary | ICD-10-CM | POA: Diagnosis not present

## 2022-02-18 DIAGNOSIS — R928 Other abnormal and inconclusive findings on diagnostic imaging of breast: Secondary | ICD-10-CM | POA: Diagnosis not present

## 2022-03-08 DIAGNOSIS — R7989 Other specified abnormal findings of blood chemistry: Secondary | ICD-10-CM | POA: Diagnosis not present

## 2022-03-08 DIAGNOSIS — D751 Secondary polycythemia: Secondary | ICD-10-CM | POA: Diagnosis not present

## 2022-03-08 DIAGNOSIS — Z79899 Other long term (current) drug therapy: Secondary | ICD-10-CM | POA: Diagnosis not present

## 2022-03-08 DIAGNOSIS — R946 Abnormal results of thyroid function studies: Secondary | ICD-10-CM | POA: Diagnosis not present

## 2022-03-08 DIAGNOSIS — K219 Gastro-esophageal reflux disease without esophagitis: Secondary | ICD-10-CM | POA: Diagnosis not present

## 2022-03-08 DIAGNOSIS — Z6841 Body Mass Index (BMI) 40.0 and over, adult: Secondary | ICD-10-CM | POA: Diagnosis not present

## 2022-03-08 DIAGNOSIS — E782 Mixed hyperlipidemia: Secondary | ICD-10-CM | POA: Diagnosis not present

## 2022-03-08 DIAGNOSIS — I10 Essential (primary) hypertension: Secondary | ICD-10-CM | POA: Diagnosis not present

## 2022-03-08 DIAGNOSIS — Z0000 Encounter for general adult medical examination without abnormal findings: Secondary | ICD-10-CM | POA: Diagnosis not present

## 2022-09-07 DIAGNOSIS — M5137 Other intervertebral disc degeneration, lumbosacral region: Secondary | ICD-10-CM | POA: Diagnosis not present

## 2022-09-07 DIAGNOSIS — E782 Mixed hyperlipidemia: Secondary | ICD-10-CM | POA: Diagnosis not present

## 2022-09-07 DIAGNOSIS — D751 Secondary polycythemia: Secondary | ICD-10-CM | POA: Diagnosis not present

## 2022-09-07 DIAGNOSIS — I10 Essential (primary) hypertension: Secondary | ICD-10-CM | POA: Diagnosis not present

## 2022-09-07 DIAGNOSIS — K219 Gastro-esophageal reflux disease without esophagitis: Secondary | ICD-10-CM | POA: Diagnosis not present

## 2022-09-07 DIAGNOSIS — Z6841 Body Mass Index (BMI) 40.0 and over, adult: Secondary | ICD-10-CM | POA: Diagnosis not present

## 2022-09-07 DIAGNOSIS — Z79899 Other long term (current) drug therapy: Secondary | ICD-10-CM | POA: Diagnosis not present

## 2022-09-07 DIAGNOSIS — R946 Abnormal results of thyroid function studies: Secondary | ICD-10-CM | POA: Diagnosis not present

## 2022-11-26 DIAGNOSIS — H43813 Vitreous degeneration, bilateral: Secondary | ICD-10-CM | POA: Diagnosis not present

## 2022-11-26 DIAGNOSIS — H40003 Preglaucoma, unspecified, bilateral: Secondary | ICD-10-CM | POA: Diagnosis not present

## 2022-11-26 DIAGNOSIS — H2513 Age-related nuclear cataract, bilateral: Secondary | ICD-10-CM | POA: Diagnosis not present

## 2023-01-13 ENCOUNTER — Other Ambulatory Visit: Payer: Self-pay | Admitting: Internal Medicine

## 2023-01-13 DIAGNOSIS — Z1231 Encounter for screening mammogram for malignant neoplasm of breast: Secondary | ICD-10-CM

## 2023-02-21 ENCOUNTER — Ambulatory Visit
Admission: RE | Admit: 2023-02-21 | Discharge: 2023-02-21 | Disposition: A | Discharge status: Home / Self Care | Payer: PPO | Source: Ambulatory Visit | Attending: Internal Medicine | Admitting: Internal Medicine

## 2023-02-21 DIAGNOSIS — Z1231 Encounter for screening mammogram for malignant neoplasm of breast: Secondary | ICD-10-CM | POA: Diagnosis not present

## 2023-03-11 DIAGNOSIS — Z23 Encounter for immunization: Secondary | ICD-10-CM | POA: Diagnosis not present

## 2023-03-11 DIAGNOSIS — I10 Essential (primary) hypertension: Secondary | ICD-10-CM | POA: Diagnosis not present

## 2023-03-11 DIAGNOSIS — E782 Mixed hyperlipidemia: Secondary | ICD-10-CM | POA: Diagnosis not present

## 2023-03-11 DIAGNOSIS — Z79899 Other long term (current) drug therapy: Secondary | ICD-10-CM | POA: Diagnosis not present

## 2023-03-11 DIAGNOSIS — Z0000 Encounter for general adult medical examination without abnormal findings: Secondary | ICD-10-CM | POA: Diagnosis not present

## 2023-03-11 DIAGNOSIS — E66813 Obesity, class 3: Secondary | ICD-10-CM | POA: Diagnosis not present

## 2023-03-11 DIAGNOSIS — Z6841 Body Mass Index (BMI) 40.0 and over, adult: Secondary | ICD-10-CM | POA: Diagnosis not present

## 2023-03-11 DIAGNOSIS — K219 Gastro-esophageal reflux disease without esophagitis: Secondary | ICD-10-CM | POA: Diagnosis not present

## 2023-03-11 DIAGNOSIS — R946 Abnormal results of thyroid function studies: Secondary | ICD-10-CM | POA: Diagnosis not present

## 2023-05-06 DIAGNOSIS — H0015 Chalazion left lower eyelid: Secondary | ICD-10-CM | POA: Diagnosis not present

## 2023-09-09 DIAGNOSIS — K219 Gastro-esophageal reflux disease without esophagitis: Secondary | ICD-10-CM | POA: Diagnosis not present

## 2023-09-09 DIAGNOSIS — E7800 Pure hypercholesterolemia, unspecified: Secondary | ICD-10-CM | POA: Diagnosis not present

## 2023-09-09 DIAGNOSIS — R946 Abnormal results of thyroid function studies: Secondary | ICD-10-CM | POA: Diagnosis not present

## 2023-09-09 DIAGNOSIS — Z79899 Other long term (current) drug therapy: Secondary | ICD-10-CM | POA: Diagnosis not present

## 2023-09-09 DIAGNOSIS — I10 Essential (primary) hypertension: Secondary | ICD-10-CM | POA: Diagnosis not present

## 2023-09-09 DIAGNOSIS — R7989 Other specified abnormal findings of blood chemistry: Secondary | ICD-10-CM | POA: Diagnosis not present

## 2023-09-09 DIAGNOSIS — E66813 Obesity, class 3: Secondary | ICD-10-CM | POA: Diagnosis not present

## 2023-09-09 DIAGNOSIS — Z0000 Encounter for general adult medical examination without abnormal findings: Secondary | ICD-10-CM | POA: Diagnosis not present

## 2023-09-09 DIAGNOSIS — Z6841 Body Mass Index (BMI) 40.0 and over, adult: Secondary | ICD-10-CM | POA: Diagnosis not present

## 2023-11-28 DIAGNOSIS — H43813 Vitreous degeneration, bilateral: Secondary | ICD-10-CM | POA: Diagnosis not present

## 2023-11-28 DIAGNOSIS — H40003 Preglaucoma, unspecified, bilateral: Secondary | ICD-10-CM | POA: Diagnosis not present

## 2023-11-28 DIAGNOSIS — H2513 Age-related nuclear cataract, bilateral: Secondary | ICD-10-CM | POA: Diagnosis not present

## 2024-01-18 ENCOUNTER — Other Ambulatory Visit: Payer: Self-pay | Admitting: Internal Medicine

## 2024-01-18 DIAGNOSIS — Z1231 Encounter for screening mammogram for malignant neoplasm of breast: Secondary | ICD-10-CM

## 2024-02-22 ENCOUNTER — Ambulatory Visit
Admission: RE | Admit: 2024-02-22 | Discharge: 2024-02-22 | Disposition: A | Discharge status: Home / Self Care | Source: Ambulatory Visit | Attending: Internal Medicine | Admitting: Internal Medicine

## 2024-02-22 DIAGNOSIS — Z1231 Encounter for screening mammogram for malignant neoplasm of breast: Secondary | ICD-10-CM | POA: Diagnosis not present

## 2024-03-13 DIAGNOSIS — K219 Gastro-esophageal reflux disease without esophagitis: Secondary | ICD-10-CM | POA: Diagnosis not present

## 2024-03-13 DIAGNOSIS — E66813 Obesity, class 3: Secondary | ICD-10-CM | POA: Diagnosis not present

## 2024-03-13 DIAGNOSIS — R7989 Other specified abnormal findings of blood chemistry: Secondary | ICD-10-CM | POA: Diagnosis not present

## 2024-03-13 DIAGNOSIS — Z79899 Other long term (current) drug therapy: Secondary | ICD-10-CM | POA: Diagnosis not present

## 2024-03-13 DIAGNOSIS — M2550 Pain in unspecified joint: Secondary | ICD-10-CM | POA: Diagnosis not present

## 2024-03-13 DIAGNOSIS — I10 Essential (primary) hypertension: Secondary | ICD-10-CM | POA: Diagnosis not present

## 2024-03-13 DIAGNOSIS — E782 Mixed hyperlipidemia: Secondary | ICD-10-CM | POA: Diagnosis not present

## 2024-03-13 DIAGNOSIS — R8290 Unspecified abnormal findings in urine: Secondary | ICD-10-CM | POA: Diagnosis not present

## 2024-03-13 DIAGNOSIS — D751 Secondary polycythemia: Secondary | ICD-10-CM | POA: Diagnosis not present

## 2024-03-13 DIAGNOSIS — Z0000 Encounter for general adult medical examination without abnormal findings: Secondary | ICD-10-CM | POA: Diagnosis not present

## 2024-03-13 DIAGNOSIS — Z23 Encounter for immunization: Secondary | ICD-10-CM | POA: Diagnosis not present

## 2024-03-13 DIAGNOSIS — Z1331 Encounter for screening for depression: Secondary | ICD-10-CM | POA: Diagnosis not present
